# Patient Record
Sex: Male | Born: 1995 | Race: White | Hispanic: No | Marital: Single | Smoking: Current every day smoker
Health system: Southern US, Community
[De-identification: ages and names within clinical notes are randomized; demographics above are authoritative.]

## PROBLEM LIST (undated history)

## (undated) DIAGNOSIS — R011 Cardiac murmur, unspecified: Secondary | ICD-10-CM

---
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
## 2015-02-07 ENCOUNTER — Emergency Department (HOSPITAL_COMMUNITY)
Admission: EM | Admit: 2015-02-07 | Discharge: 2015-02-07 | Disposition: A | Discharge status: Home / Self Care | Payer: 59 | Attending: Emergency Medicine | Admitting: Emergency Medicine

## 2015-02-07 ENCOUNTER — Emergency Department (HOSPITAL_COMMUNITY): Payer: 59

## 2015-02-07 ENCOUNTER — Encounter (HOSPITAL_COMMUNITY): Payer: Self-pay | Admitting: Emergency Medicine

## 2015-02-07 DIAGNOSIS — R011 Cardiac murmur, unspecified: Secondary | ICD-10-CM | POA: Diagnosis not present

## 2015-02-07 DIAGNOSIS — R1013 Epigastric pain: Secondary | ICD-10-CM | POA: Diagnosis not present

## 2015-02-07 DIAGNOSIS — X30XXXA Exposure to excessive natural heat, initial encounter: Secondary | ICD-10-CM | POA: Insufficient documentation

## 2015-02-07 DIAGNOSIS — Y990 Civilian activity done for income or pay: Secondary | ICD-10-CM | POA: Insufficient documentation

## 2015-02-07 DIAGNOSIS — R109 Unspecified abdominal pain: Secondary | ICD-10-CM

## 2015-02-07 DIAGNOSIS — Y9389 Activity, other specified: Secondary | ICD-10-CM | POA: Insufficient documentation

## 2015-02-07 DIAGNOSIS — E860 Dehydration: Secondary | ICD-10-CM | POA: Insufficient documentation

## 2015-02-07 DIAGNOSIS — Z720 Tobacco use: Secondary | ICD-10-CM | POA: Insufficient documentation

## 2015-02-07 DIAGNOSIS — Y9289 Other specified places as the place of occurrence of the external cause: Secondary | ICD-10-CM | POA: Diagnosis not present

## 2015-02-07 DIAGNOSIS — T675XXA Heat exhaustion, unspecified, initial encounter: Secondary | ICD-10-CM | POA: Insufficient documentation

## 2015-02-07 DIAGNOSIS — R1110 Vomiting, unspecified: Secondary | ICD-10-CM | POA: Diagnosis not present

## 2015-02-07 HISTORY — DX: Cardiac murmur, unspecified: R01.1

## 2015-02-07 LAB — URINALYSIS, ROUTINE W REFLEX MICROSCOPIC
Glucose, UA: NEGATIVE mg/dL
HGB URINE DIPSTICK: NEGATIVE
Ketones, ur: >80 mg/dL — AB
Leukocytes, UA: NEGATIVE
Nitrite: NEGATIVE
Protein, ur: NEGATIVE mg/dL
SPECIFIC GRAVITY, URINE: 1.03 (ref 1.005–1.030)
Urobilinogen, UA: 0.2 mg/dL (ref 0.0–1.0)
pH: 5.5 (ref 5.0–8.0)

## 2015-02-07 LAB — CBC WITH DIFFERENTIAL/PLATELET
Basophils Absolute: 0 10*3/uL (ref 0.0–0.1)
Basophils Relative: 0 % (ref 0–1)
EOS PCT: 0 % (ref 0–5)
Eosinophils Absolute: 0 10*3/uL (ref 0.0–0.7)
HCT: 39.2 % (ref 39.0–52.0)
Hemoglobin: 14.2 g/dL (ref 13.0–17.0)
LYMPHS ABS: 2.4 10*3/uL (ref 0.7–4.0)
LYMPHS PCT: 10 % — AB (ref 12–46)
MCH: 29.6 pg (ref 26.0–34.0)
MCHC: 36.2 g/dL — AB (ref 30.0–36.0)
MCV: 81.8 fL (ref 78.0–100.0)
MONOS PCT: 9 % (ref 3–12)
Monocytes Absolute: 2.2 10*3/uL — ABNORMAL HIGH (ref 0.1–1.0)
Neutro Abs: 19.3 10*3/uL — ABNORMAL HIGH (ref 1.7–7.7)
Neutrophils Relative %: 81 % — ABNORMAL HIGH (ref 43–77)
PLATELETS: 307 10*3/uL (ref 150–400)
RBC: 4.79 MIL/uL (ref 4.22–5.81)
RDW: 12 % (ref 11.5–15.5)
WBC: 23.9 10*3/uL — ABNORMAL HIGH (ref 4.0–10.5)

## 2015-02-07 LAB — COMPREHENSIVE METABOLIC PANEL
ALT: 17 U/L (ref 17–63)
AST: 26 U/L (ref 15–41)
Albumin: 5.5 g/dL — ABNORMAL HIGH (ref 3.5–5.0)
Alkaline Phosphatase: 83 U/L (ref 38–126)
Anion gap: 18 — ABNORMAL HIGH (ref 5–15)
BUN: 16 mg/dL (ref 6–20)
CHLORIDE: 101 mmol/L (ref 101–111)
CO2: 19 mmol/L — AB (ref 22–32)
Calcium: 9.2 mg/dL (ref 8.9–10.3)
Creatinine, Ser: 0.99 mg/dL (ref 0.61–1.24)
Glucose, Bld: 95 mg/dL (ref 65–99)
Potassium: 3.6 mmol/L (ref 3.5–5.1)
Sodium: 138 mmol/L (ref 135–145)
Total Bilirubin: 2.5 mg/dL — ABNORMAL HIGH (ref 0.3–1.2)
Total Protein: 8 g/dL (ref 6.5–8.1)

## 2015-02-07 LAB — LIPASE, BLOOD: Lipase: 11 U/L — ABNORMAL LOW (ref 22–51)

## 2015-02-07 LAB — I-STAT TROPONIN, ED: TROPONIN I, POC: 0.02 ng/mL (ref 0.00–0.08)

## 2015-02-07 MED ORDER — SODIUM CHLORIDE 0.9 % IV BOLUS (SEPSIS)
1000.0000 mL | Freq: Once | INTRAVENOUS | Status: AC
  Administered 2015-02-07: 1000 mL via INTRAVENOUS

## 2015-02-07 MED ORDER — METOCLOPRAMIDE HCL 10 MG PO TABS: 10.0000 mg | ORAL_TABLET | Freq: Four times a day (QID) | ORAL | Status: DC | PRN

## 2015-02-07 MED ORDER — ONDANSETRON HCL 4 MG/2ML IJ SOLN
4.0000 mg | Freq: Once | INTRAMUSCULAR | Status: AC
  Administered 2015-02-07: 4 mg via INTRAVENOUS
  Filled 2015-02-07: qty 2

## 2015-02-07 NOTE — ED Provider Notes (Signed)
CSN: [id number]     Arrival date & time 02/07/15  1804 History   First MD Initiated Contact with Patient 02/07/15 1826     Chief Complaint  Patient presents with  . Heat Exposure  . Emesis     (Consider location/radiation/quality/duration/timing/severity/associated sxs/prior Treatment) The history is provided by the patient.  Hector Sutton is a 19 y.o. male who presented with possible heat exhaustion, nausea vomiting. Patient works at [employer] and has been in the truck unloading boxes all day. States that the temperature in the truck has come up to about 100F. he has not been drinking much water and only ate a biscuit for breakfast and skipped lunch. He got home and feels nauseated and has some abdominal pain. He vomited several times as well. Has some chest pain after he vomits.   Past Medical History  Diagnosis Date  . Heart murmur    History reviewed. No pertinent past surgical history. No family history on file. History  Substance Use Topics  . Smoking status: Current Every Day Smoker  . Smokeless tobacco: Not on file  . Alcohol Use: Yes    Review of Systems  Gastrointestinal: Positive for vomiting.  All other systems reviewed and are negative.     Allergies  Review of patient's allergies indicates no known allergies.  Home Medications   Prior to Admission medications   Not on File   BP 125/63 mmHg  Pulse 85  Temp(Src) 97.7 F (36.5 C) (Oral)  Resp 16  SpO2 100% Physical Exam  Constitutional: He is oriented to person, place, and time.  Dehydrated   HENT:  Head: Normocephalic.  MM slightly dry   Eyes: Conjunctivae are normal. Pupils are equal, round, and reactive to light.  Neck: Normal range of motion. Neck supple.  Cardiovascular: Normal rate, regular rhythm and normal heart sounds.   Pulmonary/Chest: Effort normal and breath sounds normal. No respiratory distress. He has no wheezes. He has no rales.  Abdominal: Soft. Bowel sounds are normal.  Mild  epigastric tenderness, no RUQ tenderness   Musculoskeletal: Normal range of motion. He exhibits no edema or tenderness.  Neurological: He is alert and oriented to person, place, and time. No cranial nerve deficit. Coordination normal.  Skin: Skin is warm and dry.  Psychiatric: He has a normal mood and affect. His behavior is normal. Judgment and thought content normal.  Nursing note and vitals reviewed.   ED Course  Procedures (including critical care time) Labs Review Labs Reviewed  CBC WITH DIFFERENTIAL/PLATELET - Abnormal; Notable for the following:    WBC 23.9 (*)    MCHC 36.2 (*)    Neutrophils Relative % 81 (*)    Lymphocytes Relative 10 (*)    Neutro Abs 19.3 (*)    Monocytes Absolute 2.2 (*)    All other components within normal limits  COMPREHENSIVE METABOLIC PANEL - Abnormal; Notable for the following:    CO2 19 (*)    Albumin 5.5 (*)    Total Bilirubin 2.5 (*)    Anion gap 18 (*)    All other components within normal limits  LIPASE, BLOOD - Abnormal; Notable for the following:    Lipase 11 (*)    All other components within normal limits  URINALYSIS, ROUTINE W REFLEX MICROSCOPIC (NOT AT [facility]) - Abnormal; Notable for the following:    Bilirubin Urine SMALL (*)    Ketones, ur >80 (*)    All other components within normal limits  Rosezena Sensor, ED  Imaging Review Dg Abd Acute W/chest  02/07/2015   CLINICAL DATA:  Vomiting.  EXAM: DG ABDOMEN ACUTE W/ 1V CHEST  COMPARISON:  06/18/2013 abdominal CT  FINDINGS: There is no evidence of dilated bowel loops or free intraperitoneal air. No radiopaque calculi or other significant radiographic abnormality is seen. Heart size and mediastinal contours are within normal limits. Both lungs are clear.  IMPRESSION: Negative abdominal radiographs.  No acute cardiopulmonary disease.   Electronically Signed   By: Marnee Spring M.D.   On: 02/07/2015 19:15     EKG Interpretation   Date/Time:  Tuesday February 07 2015 18:35:19  EDT Ventricular Rate:  89 PR Interval:  139 QRS Duration: 110 QT Interval:  397 QTC Calculation: 483 R Axis:   100 Text Interpretation:  Sinus arrhythmia Borderline right axis deviation  Borderline Q waves in lateral leads Borderline prolonged QT interval No  significant change since last tracing Confirmed by Miyani Cronic  MD, Aikam Vinje ([id number])  on 02/07/2015 6:55:07 PM      MDM   Final diagnoses:  Vomiting  Abdominal pain   Hector Sutton is a 19 y.o. male here with ab pain, possible heat exhaustion. Temp nl. Appears slightly dehydrated. Will check labs, UA, hydrate.   8pm WBC 24. Bicarb 19. Ordered CT ab/pel initially but patient refused. Repeat ab exam showed nontender abdomen. Bili 2.5, improved from previous. States that he has gingivitis. On exam, has no periapical abscess or facial swelling. Had WBC 23 2 years ago and CT at that time showed gastro. Since he has no other signs of sepsis, likely stress from dehydration and heat exhaustion.   8:48 PM UA > 80 ketones. Given 2 L NS. Will dc home.    Richardean Canal, MD 02/07/15 2049

## 2015-02-07 NOTE — Discharge Instructions (Signed)
Stay hydrated.   Take reglan for nausea.   Your white blood cell count is elevated, repeat in a week.   Follow up with your doctor.   Return to ER if you have worse vomiting, dehydration, fever.

## 2015-02-07 NOTE — ED Notes (Signed)
Patient is unable to urinate at this time 

## 2015-02-07 NOTE — ED Notes (Signed)
Pt states he started working today at [employer] and feels like he may have heat exhaustion. N/V. Alert and oriented.

## 2015-03-03 ENCOUNTER — Emergency Department (HOSPITAL_COMMUNITY)
Admission: EM | Admit: 2015-03-03 | Discharge: 2015-03-03 | Disposition: A | Discharge status: Home / Self Care | Payer: 59 | Attending: Emergency Medicine | Admitting: Emergency Medicine

## 2015-03-03 ENCOUNTER — Encounter (HOSPITAL_COMMUNITY): Payer: Self-pay | Admitting: Emergency Medicine

## 2015-03-03 DIAGNOSIS — R531 Weakness: Secondary | ICD-10-CM | POA: Diagnosis not present

## 2015-03-03 DIAGNOSIS — R011 Cardiac murmur, unspecified: Secondary | ICD-10-CM | POA: Insufficient documentation

## 2015-03-03 DIAGNOSIS — Z720 Tobacco use: Secondary | ICD-10-CM | POA: Diagnosis not present

## 2015-03-03 LAB — CBC WITH DIFFERENTIAL/PLATELET
BASOS ABS: 0 10*3/uL (ref 0.0–0.1)
Basophils Relative: 1 % (ref 0–1)
EOS PCT: 2 % (ref 0–5)
Eosinophils Absolute: 0.1 10*3/uL (ref 0.0–0.7)
HCT: 42.8 % (ref 39.0–52.0)
Hemoglobin: 14.9 g/dL (ref 13.0–17.0)
LYMPHS PCT: 25 % (ref 12–46)
Lymphs Abs: 1.4 10*3/uL (ref 0.7–4.0)
MCH: 30.3 pg (ref 26.0–34.0)
MCHC: 34.8 g/dL (ref 30.0–36.0)
MCV: 87.2 fL (ref 78.0–100.0)
MONO ABS: 0.5 10*3/uL (ref 0.1–1.0)
MONOS PCT: 9 % (ref 3–12)
NEUTROS ABS: 3.5 10*3/uL (ref 1.7–7.7)
Neutrophils Relative %: 63 % (ref 43–77)
Platelets: 267 10*3/uL (ref 150–400)
RBC: 4.91 MIL/uL (ref 4.22–5.81)
RDW: 13 % (ref 11.5–15.5)
WBC: 5.5 10*3/uL (ref 4.0–10.5)

## 2015-03-03 LAB — COMPREHENSIVE METABOLIC PANEL
ALT: 20 U/L (ref 17–63)
AST: 19 U/L (ref 15–41)
Albumin: 4.8 g/dL (ref 3.5–5.0)
Alkaline Phosphatase: 84 U/L (ref 38–126)
Anion gap: 5 (ref 5–15)
BUN: 15 mg/dL (ref 6–20)
CALCIUM: 8.8 mg/dL — AB (ref 8.9–10.3)
CHLORIDE: 106 mmol/L (ref 101–111)
CO2: 27 mmol/L (ref 22–32)
Creatinine, Ser: 0.71 mg/dL (ref 0.61–1.24)
GFR calc Af Amer: >60 mL/min (ref >60)
GLUCOSE: 95 mg/dL (ref 65–99)
Potassium: 4.4 mmol/L (ref 3.5–5.1)
SODIUM: 138 mmol/L (ref 135–145)
Total Bilirubin: 1.2 mg/dL (ref 0.3–1.2)
Total Protein: 7.7 g/dL (ref 6.5–8.1)

## 2015-03-03 LAB — CK: Total CK: 120 U/L (ref 49–397)

## 2015-03-03 LAB — TSH: TSH: 0.953 u[IU]/mL (ref 0.350–4.500)

## 2015-03-03 MED ORDER — SODIUM CHLORIDE 0.9 % IV BOLUS (SEPSIS)
1000.0000 mL | Freq: Once | INTRAVENOUS | Status: AC
  Administered 2015-03-03: 1000 mL via INTRAVENOUS

## 2015-03-03 NOTE — Discharge Instructions (Signed)
If you were given medicines take as directed.  If you are on coumadin or contraceptives realize their levels and effectiveness is altered by many different medicines.  If you have any reaction (rash, tongues swelling, other) to the medicines stop taking and see a physician.   Follow up with primary doctor next week for Thyroid test result that was sent in the ER.   If your blood pressure was elevated in the ER make sure you follow up for management with a primary doctor or return for chest pain, shortness of breath or stroke symptoms.  Please follow up as directed and return to the ER or see a physician for new or worsening symptoms.  Thank you. Filed Vitals:   03/03/15 0950  BP: 114/54  Pulse: 67  Temp: 98.2 F (36.8 C)  TempSrc: Oral  Resp: 16  SpO2: 100%

## 2015-03-03 NOTE — ED Notes (Signed)
MD at bedside. 

## 2015-03-03 NOTE — ED Notes (Signed)
Questions r/t dc were denied. Pt ambulatory and a&ox4 

## 2015-03-03 NOTE — ED Notes (Signed)
Per pt, [place]lab work showed he had infection-still having symptoms and eants to be checked out

## 2015-03-03 NOTE — ED Provider Notes (Signed)
CSN: [id number]     Arrival date & time 03/03/15  7829 History   First MD Initiated Contact with Patient 03/03/15 [phone]     Chief Complaint  Patient presents with  . Weakness     (Consider location/radiation/quality/duration/timing/severity/associated sxs/prior Treatment) HPI Comments: 19 year old male with no significant medical history, cigarettes smoker presents with general fatigue and general weakness for the past few weeks. Patient was seen a few weeks back and had general blood work showing significant leukocytosis, patient was dehydrated and heat exhaustion, treated with fluids and improved. Patient reports generally worsening weakness, works in the heat however has been tolerating oral fluids. No vomiting or headaches. No weight loss however no weight gain despite eating regular meals. No lymphadenopathy appreciated. No thyroid history known. Nothing improves his general fatigue.  Patient is a 19 y.o. male presenting with weakness. The history is provided by the patient.  Weakness Pertinent negatives include no chest pain, no abdominal pain, no headaches and no shortness of breath.    Past Medical History  Diagnosis Date  . Heart murmur    History reviewed. No pertinent past surgical history. No family history on file. History  Substance Use Topics  . Smoking status: Current Every Day Smoker  . Smokeless tobacco: Not on file  . Alcohol Use: Yes    Review of Systems  Constitutional: Positive for fatigue. Negative for fever and chills.  HENT: Negative for congestion.   Eyes: Negative for visual disturbance.  Respiratory: Negative for shortness of breath.   Cardiovascular: Negative for chest pain.  Gastrointestinal: Negative for vomiting and abdominal pain.  Genitourinary: Negative for dysuria and flank pain.  Musculoskeletal: Negative for back pain, neck pain and neck stiffness.  Skin: Negative for rash.  Neurological: Positive for weakness. Negative for light-headedness  and headaches.      Allergies  Review of patient's allergies indicates no known allergies.  Home Medications   Prior to Admission medications   Medication Sig Start Date End Date Taking? Authorizing Provider  metoCLOPramide (REGLAN) 10 MG tablet Take 1 tablet (10 mg total) by mouth every 6 (six) hours as needed for nausea. Patient not taking: Reported on 03/03/2015 02/07/15   Richardean Canal, MD   BP 114/54 mmHg  Pulse 67  Temp(Src) 98.2 F (36.8 C) (Oral)  Resp 16  SpO2 100% Physical Exam  Constitutional: He is oriented to person, place, and time. He appears well-developed and well-nourished.  HENT:  Head: Normocephalic and atraumatic.  No exudate erythema or swelling posterior pharynx, neck supple no significant lymphadenopathy, mild dry mucous membranes.  Eyes: Conjunctivae are normal. Right eye exhibits no discharge. Left eye exhibits no discharge.  Neck: Normal range of motion. Neck supple. No tracheal deviation present.  Cardiovascular: Normal rate and regular rhythm.   No murmur heard. Pulmonary/Chest: Effort normal and breath sounds normal.  Abdominal: Soft. He exhibits no distension. There is no tenderness. There is no guarding.  Musculoskeletal: He exhibits no edema.  Neurological: He is alert and oriented to person, place, and time.  Skin: Skin is warm. No rash noted.  No significant lymphadenopathy cervical, supraclavicular, femoral  Psychiatric: He has a normal mood and affect.  Nursing note and vitals reviewed.   ED Course  Procedures (including critical care time) Labs Review Labs Reviewed  COMPREHENSIVE METABOLIC PANEL - Abnormal; Notable for the following:    Calcium 8.8 (*)    All other components within normal limits  CBC WITH DIFFERENTIAL/PLATELET  CK  TSH  Imaging Review No results found.   EKG Interpretation None      MDM   Final diagnoses:  General weakness   Patient presents with general fatigue/weakness, no source of infection on  exam, well-appearing. Reviewed last blood work showing significant leukocytosis however patient is dehydrated and nauseated at the time. Plan for repeat blood work, IV fluids. Discussed importance of outpatient follow-up. Screening blood work unremarkable, thyroid test pending. Patient stable to follow-up outpatient for thyroid test result. Results and differential diagnosis were discussed with the patient/parent/guardian. Xrays were independently reviewed by myself.  Close follow up outpatient was discussed, comfortable with the plan.   Medications  sodium chloride 0.9 % bolus 1,000 mL (1,000 mLs Intravenous New Bag/Given 03/03/15 1023)    Filed Vitals:   03/03/15 0950  BP: 114/54  Pulse: 67  Temp: 98.2 F (36.8 C)  TempSrc: Oral  Resp: 16  SpO2: 100%    Final diagnoses:  General weakness       Blane OharaJoshua Ulric Salzman, MD 03/03/15 [phone]

## 2015-04-09 ENCOUNTER — Emergency Department (HOSPITAL_COMMUNITY)
Admission: EM | Admit: 2015-04-09 | Discharge: 2015-04-09 | Disposition: A | Discharge status: Home / Self Care | Payer: Commercial Managed Care - HMO | Attending: Emergency Medicine | Admitting: Emergency Medicine

## 2015-04-09 ENCOUNTER — Encounter (HOSPITAL_COMMUNITY): Payer: Self-pay | Admitting: Emergency Medicine

## 2015-04-09 DIAGNOSIS — Y9389 Activity, other specified: Secondary | ICD-10-CM | POA: Insufficient documentation

## 2015-04-09 DIAGNOSIS — Y9289 Other specified places as the place of occurrence of the external cause: Secondary | ICD-10-CM | POA: Diagnosis not present

## 2015-04-09 DIAGNOSIS — R011 Cardiac murmur, unspecified: Secondary | ICD-10-CM | POA: Insufficient documentation

## 2015-04-09 DIAGNOSIS — W25XXXA Contact with sharp glass, initial encounter: Secondary | ICD-10-CM | POA: Diagnosis not present

## 2015-04-09 DIAGNOSIS — Y998 Other external cause status: Secondary | ICD-10-CM | POA: Insufficient documentation

## 2015-04-09 DIAGNOSIS — T07XXXA Unspecified multiple injuries, initial encounter: Secondary | ICD-10-CM

## 2015-04-09 DIAGNOSIS — S0181XA Laceration without foreign body of other part of head, initial encounter: Secondary | ICD-10-CM | POA: Diagnosis not present

## 2015-04-09 DIAGNOSIS — S41111A Laceration without foreign body of right upper arm, initial encounter: Secondary | ICD-10-CM | POA: Insufficient documentation

## 2015-04-09 DIAGNOSIS — S41011A Laceration without foreign body of right shoulder, initial encounter: Secondary | ICD-10-CM | POA: Diagnosis not present

## 2015-04-09 DIAGNOSIS — S4991XA Unspecified injury of right shoulder and upper arm, initial encounter: Secondary | ICD-10-CM | POA: Diagnosis present

## 2015-04-09 DIAGNOSIS — Z720 Tobacco use: Secondary | ICD-10-CM | POA: Diagnosis not present

## 2015-04-09 MED ORDER — HYDROCODONE-ACETAMINOPHEN 5-325 MG PO TABS
2.0000 | ORAL_TABLET | Freq: Once | ORAL | Status: AC
  Administered 2015-04-09: 2 via ORAL
  Filled 2015-04-09: qty 2

## 2015-04-09 NOTE — Discharge Instructions (Signed)
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
## 2015-05-27 ENCOUNTER — Encounter (HOSPITAL_COMMUNITY): Payer: Self-pay | Admitting: *Deleted

## 2015-05-27 ENCOUNTER — Emergency Department (HOSPITAL_COMMUNITY)
Admission: EM | Admit: 2015-05-27 | Discharge: 2015-05-27 | Disposition: A | Discharge status: Home / Self Care | Payer: Commercial Managed Care - HMO | Attending: Emergency Medicine | Admitting: Emergency Medicine

## 2015-05-27 DIAGNOSIS — R011 Cardiac murmur, unspecified: Secondary | ICD-10-CM | POA: Insufficient documentation

## 2015-05-27 DIAGNOSIS — H9201 Otalgia, right ear: Secondary | ICD-10-CM | POA: Insufficient documentation

## 2015-05-27 DIAGNOSIS — J029 Acute pharyngitis, unspecified: Secondary | ICD-10-CM | POA: Insufficient documentation

## 2015-05-27 DIAGNOSIS — Z720 Tobacco use: Secondary | ICD-10-CM | POA: Insufficient documentation

## 2015-05-27 LAB — RAPID STREP SCREEN (MED CTR MEBANE ONLY): STREPTOCOCCUS, GROUP A SCREEN (DIRECT): NEGATIVE

## 2015-05-27 MED ORDER — IBUPROFEN 600 MG PO TABS: 600.0000 mg | ORAL_TABLET | Freq: Four times a day (QID) | ORAL | Status: DC | PRN

## 2015-05-27 MED ORDER — IBUPROFEN 800 MG PO TABS
800.0000 mg | ORAL_TABLET | Freq: Once | ORAL | Status: DC
Start: 2015-05-27 — End: 2015-05-28

## 2015-05-27 NOTE — ED Notes (Signed)
Discharge instructions given, pt demonstrated teach back and verbal understanding. No concerns voiced.  

## 2015-05-27 NOTE — Discharge Instructions (Signed)
Your strep test is negative tonight, so your symptoms are most likely viral which will improve with time.  Use the ibuprofen for pain relief.     Pharyngitis Pharyngitis is redness, pain, and swelling (inflammation) of your pharynx.  CAUSES  Pharyngitis is usually caused by infection. Most of the time, these infections are from viruses (viral) and are part of a cold. However, sometimes pharyngitis is caused by bacteria (bacterial). Pharyngitis can also be caused by allergies. Viral pharyngitis may be spread from person to person by coughing, sneezing, and personal items or utensils (cups, forks, spoons, toothbrushes). Bacterial pharyngitis may be spread from person to person by more intimate contact, such as kissing.  SIGNS AND SYMPTOMS  Symptoms of pharyngitis include:   Sore throat.   Tiredness (fatigue).   Low-grade fever.   Headache.  Joint pain and muscle aches.  Skin rashes.  Swollen lymph nodes.  Plaque-like film on throat or tonsils (often seen with bacterial pharyngitis). DIAGNOSIS  Your health care provider will ask you questions about your illness and your symptoms. Your medical history, along with a physical exam, is often all that is needed to diagnose pharyngitis. Sometimes, a rapid strep test is done. Other lab tests may also be done, depending on the suspected cause.  TREATMENT  Viral pharyngitis will usually get better in 3-4 days without the use of medicine. Bacterial pharyngitis is treated with medicines that kill germs (antibiotics).  HOME CARE INSTRUCTIONS   Drink enough water and fluids to keep your urine clear or pale yellow.   Only take over-the-counter or prescription medicines as directed by your health care provider:   If you are prescribed antibiotics, make sure you finish them even if you start to feel better.   Do not take aspirin.   Get lots of rest.   Gargle with 8 oz of salt water ( tsp of salt per 1 qt of water) as often as every  1-2 hours to soothe your throat.   Throat lozenges (if you are not at risk for choking) or sprays may be used to soothe your throat. SEEK MEDICAL CARE IF:   You have large, tender lumps in your neck.  You have a rash.  You cough up green, yellow-brown, or bloody spit. SEEK IMMEDIATE MEDICAL CARE IF:   Your neck becomes stiff.  You drool or are unable to swallow liquids.  You vomit or are unable to keep medicines or liquids down.  You have severe pain that does not go away with the use of recommended medicines.  You have trouble breathing (not caused by a stuffy nose). MAKE SURE YOU:   Understand these instructions.  Will watch your condition.  Will get help right away if you are not doing well or get worse.   This information is not intended to replace advice given to you by your health care provider. Make sure you discuss any questions you have with your health care provider.   Document Released: 08/05/2005 Document Revised: 05/26/2013 Document Reviewed: 04/12/2013 Elsevier Interactive Patient Education [employer]

## 2015-05-27 NOTE — ED Provider Notes (Signed)
CSN: [id number]     Arrival date & time 05/27/15  2055 History   First MD Initiated Contact with Patient 05/27/15 2133     Chief Complaint  Patient presents with  . Otalgia     (Consider location/radiation/quality/duration/timing/severity/associated sxs/prior Treatment) The history is provided by a parent.   Hector Sutton is a 19 y.o. male presenting with right sided ear pain along with sore throat and right swollen lymph node underneath his jaw present for the past 2 days.  His pain is worsened with swallowing.  He denies fever or chills, denies nasal congestion, drainage, cough, sob, headache or dizziness.  He has taken no medicines for his pain prior to arrival.    Past Medical History  Diagnosis Date  . Heart murmur    History reviewed. No pertinent past surgical history. History reviewed. No pertinent family history. Social History  Substance Use Topics  . Smoking status: Current Every Day Smoker  . Smokeless tobacco: None  . Alcohol Use: Yes    Review of Systems  Constitutional: Negative for fever and chills.  HENT: Positive for ear pain and sore throat. Negative for congestion, ear discharge, facial swelling, rhinorrhea, sinus pressure, trouble swallowing and voice change.   Eyes: Negative for discharge.  Respiratory: Negative for cough, shortness of breath, wheezing and stridor.   Cardiovascular: Negative for chest pain.  Gastrointestinal: Negative for abdominal pain.  Genitourinary: Negative.       Allergies  Review of patient's allergies indicates no known allergies.  Home Medications   Prior to Admission medications   Medication Sig Start Date End Date Taking? Authorizing Provider  ibuprofen (ADVIL,MOTRIN) 600 MG tablet Take 1 tablet (600 mg total) by mouth every 6 (six) hours as needed. 05/27/15   Burgess Amor, PA-C  metoCLOPramide (REGLAN) 10 MG tablet Take 1 tablet (10 mg total) by mouth every 6 (six) hours as needed for nausea. Patient not taking:  Reported on 03/03/2015 02/07/15   Richardean Canal, MD   BP 127/45 mmHg  Pulse 85  Temp(Src) 98.1 F (36.7 C) (Oral)  Resp 20  Ht  (1.778 m)  Wt 130 lb (58.968 kg)  BMI 18.65 kg/m2  SpO2 99% Physical Exam  Constitutional: He is oriented to person, place, and time. He appears well-developed and well-nourished.  HENT:  Head: Normocephalic and atraumatic.  Right Ear: Tympanic membrane and ear canal normal.  Left Ear: Tympanic membrane and ear canal normal.  Nose: No mucosal edema or rhinorrhea.  Mouth/Throat: Uvula is midline and mucous membranes are normal. No trismus in the jaw. Posterior oropharyngeal erythema present. No oropharyngeal exudate, posterior oropharyngeal edema or tonsillar abscesses.  Tonsils symmetric, mild edema 1+.    Eyes: Conjunctivae are normal.  Cardiovascular: Normal rate and normal heart sounds.   Pulmonary/Chest: Effort normal. No respiratory distress. He has no wheezes. He has no rales.  Musculoskeletal: Normal range of motion.  Lymphadenopathy:       Head (right side): Submandibular adenopathy present.       Head (left side): Submandibular adenopathy present.  Neurological: He is alert and oriented to person, place, and time.  Skin: Skin is warm and dry. No rash noted.  Psychiatric: He has a normal mood and affect.    ED Course  Procedures (including critical care time) Labs Review Labs Reviewed  RAPID STREP SCREEN (NOT AT [facility])  CULTURE, GROUP A STREP    Imaging Review No results found. I have personally reviewed and evaluated these images and lab  results as part of my medical decision-making.   EKG Interpretation None      MDM   Final diagnoses:  Viral pharyngitis    Pt advised rest, increased fluid intake, ibuprofen which was prescribed.  Strep test negative, culture pending.      Burgess Amor, PA-C 05/27/15 2357  Vanetta Mulders, MD 05/28/15 [phone]

## 2015-05-27 NOTE — ED Notes (Addendum)
Triage on wrong pt

## 2015-05-27 NOTE — ED Notes (Signed)
Pt c/o right sided ear pain x 2 days.

## 2015-05-31 LAB — CULTURE, GROUP A STREP: Strep A Culture: NEGATIVE

## 2016-06-18 ENCOUNTER — Encounter (HOSPITAL_COMMUNITY): Payer: Self-pay | Admitting: Emergency Medicine

## 2016-06-18 ENCOUNTER — Emergency Department (HOSPITAL_COMMUNITY): Payer: Self-pay

## 2016-06-18 ENCOUNTER — Emergency Department (HOSPITAL_COMMUNITY)
Admission: EM | Admit: 2016-06-18 | Discharge: 2016-06-18 | Disposition: A | Discharge status: Home / Self Care | Payer: Self-pay | Attending: Emergency Medicine | Admitting: Emergency Medicine

## 2016-06-18 DIAGNOSIS — Y929 Unspecified place or not applicable: Secondary | ICD-10-CM | POA: Insufficient documentation

## 2016-06-18 DIAGNOSIS — S0990XA Unspecified injury of head, initial encounter: Secondary | ICD-10-CM | POA: Insufficient documentation

## 2016-06-18 DIAGNOSIS — Y999 Unspecified external cause status: Secondary | ICD-10-CM | POA: Insufficient documentation

## 2016-06-18 DIAGNOSIS — Y939 Activity, unspecified: Secondary | ICD-10-CM | POA: Insufficient documentation

## 2016-06-18 DIAGNOSIS — R6884 Jaw pain: Secondary | ICD-10-CM | POA: Insufficient documentation

## 2016-06-18 DIAGNOSIS — F17200 Nicotine dependence, unspecified, uncomplicated: Secondary | ICD-10-CM | POA: Insufficient documentation

## 2016-06-18 DIAGNOSIS — F8081 Childhood onset fluency disorder: Secondary | ICD-10-CM | POA: Insufficient documentation

## 2016-06-18 MED ORDER — IBUPROFEN 400 MG PO TABS
600.0000 mg | ORAL_TABLET | Freq: Once | ORAL | Status: AC
  Administered 2016-06-18: 600 mg via ORAL
  Filled 2016-06-18: qty 1

## 2016-06-18 MED ORDER — NAPROXEN 500 MG PO TABS: 500.0000 mg | ORAL_TABLET | Freq: Two times a day (BID) | ORAL | 0 refills | Status: DC
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
## 2016-06-18 NOTE — Discharge Instructions (Signed)
Please follow up with neurology if continue to have stutter and memory issues. Ice pack to the jaw. Take tylenol or motrin. Return if any issues

## 2016-09-08 ENCOUNTER — Emergency Department (HOSPITAL_COMMUNITY): Payer: 59

## 2016-09-08 ENCOUNTER — Encounter (HOSPITAL_COMMUNITY): Payer: Self-pay | Admitting: Emergency Medicine

## 2016-09-08 ENCOUNTER — Emergency Department (HOSPITAL_COMMUNITY)
Admission: EM | Admit: 2016-09-08 | Discharge: 2016-09-08 | Disposition: A | Discharge status: Home / Self Care | Payer: 59 | Attending: Emergency Medicine | Admitting: Emergency Medicine

## 2016-09-08 DIAGNOSIS — F17200 Nicotine dependence, unspecified, uncomplicated: Secondary | ICD-10-CM | POA: Insufficient documentation

## 2016-09-08 DIAGNOSIS — J111 Influenza due to unidentified influenza virus with other respiratory manifestations: Secondary | ICD-10-CM | POA: Insufficient documentation

## 2016-09-08 DIAGNOSIS — R69 Illness, unspecified: Secondary | ICD-10-CM

## 2016-09-08 LAB — CBC WITH DIFFERENTIAL/PLATELET
Basophils Absolute: 0 10*3/uL (ref 0.0–0.1)
Basophils Relative: 0 %
Eosinophils Absolute: 0.2 10*3/uL (ref 0.0–0.7)
Eosinophils Relative: 3 %
HEMATOCRIT: 44 % (ref 39.0–52.0)
HEMOGLOBIN: 15.7 g/dL (ref 13.0–17.0)
LYMPHS PCT: 18 %
Lymphs Abs: 1.8 10*3/uL (ref 0.7–4.0)
MCH: 30.5 pg (ref 26.0–34.0)
MCHC: 35.7 g/dL (ref 30.0–36.0)
MCV: 85.4 fL (ref 78.0–100.0)
MONO ABS: 1.1 10*3/uL — AB (ref 0.1–1.0)
MONOS PCT: 11 %
NEUTROS ABS: 6.6 10*3/uL (ref 1.7–7.7)
NEUTROS PCT: 68 %
Platelets: 267 10*3/uL (ref 150–400)
RBC: 5.15 MIL/uL (ref 4.22–5.81)
RDW: 12.2 % (ref 11.5–15.5)
WBC: 9.7 10*3/uL (ref 4.0–10.5)

## 2016-09-08 LAB — BASIC METABOLIC PANEL
ANION GAP: 9 (ref 5–15)
BUN: 12 mg/dL (ref 6–20)
CALCIUM: 9.3 mg/dL (ref 8.9–10.3)
CHLORIDE: 104 mmol/L (ref 101–111)
CO2: 25 mmol/L (ref 22–32)
CREATININE: 0.74 mg/dL (ref 0.61–1.24)
GFR calc non Af Amer: >60 mL/min (ref >60)
GLUCOSE: 102 mg/dL — AB (ref 65–99)
Potassium: 3.7 mmol/L (ref 3.5–5.1)
Sodium: 138 mmol/L (ref 135–145)

## 2016-09-08 MED ORDER — BENZONATATE 100 MG PO CAPS: 100.0000 mg | ORAL_CAPSULE | Freq: Three times a day (TID) | ORAL | 0 refills | Status: DC | PRN

## 2016-09-08 MED ORDER — FLUTICASONE PROPIONATE 50 MCG/ACT NA SUSP: 2.0000 | Freq: Every day | NASAL | 0 refills | Status: DC

## 2016-09-08 MED ORDER — ACETAMINOPHEN 325 MG PO TABS
650.0000 mg | ORAL_TABLET | Freq: Once | ORAL | Status: AC
  Administered 2016-09-08: 650 mg via ORAL
  Filled 2016-09-08: qty 2

## 2016-09-08 MED ORDER — NAPROXEN 500 MG PO TABS: 500.0000 mg | ORAL_TABLET | Freq: Two times a day (BID) | ORAL | 0 refills | Status: DC

## 2016-09-08 MED ORDER — CETIRIZINE HCL 10 MG PO TABS: 10.0000 mg | ORAL_TABLET | Freq: Every day | ORAL | 1 refills | Status: DC

## 2016-09-08 NOTE — ED Provider Notes (Signed)
[facility] Provider Note   CSN: [id number] Arrival date & time: 09/08/16  0865     History   Chief Complaint Chief Complaint  Patient presents with  . Cough    Nasal Congestion   . Fever  . Generalized Body Aches    HPI Hector Sutton is a 21 y.o. male.  Hector Sutton is a 21 y.o. Male who is otherwise healthy who presents to the ED complaining of flu like symptoms for the past three days. The patient reports subjective fever, cough, body aches, chills, nasal congestion, and post nasal drip for the past three days. No treatments prior to arrival. Patient did not receive his flu shot this year. Patient denies hemoptysis, neck pain, trouble swallowing, ear discharge, chest pain, wheezing, shortness of breath, abdominal pain, nausea, vomiting or rashes.   The history is provided by the patient. No language interpreter was used.  Cough  Associated symptoms include rhinorrhea. Pertinent negatives include no chest pain, no chills, no headaches, no sore throat, no shortness of breath and no wheezing.  Fever   Associated symptoms include congestion and cough. Pertinent negatives include no chest pain, no diarrhea, no vomiting, no headaches and no sore throat.    Past Medical History:  Diagnosis Date  . Heart murmur     There are no active problems to display for this patient.   History reviewed. No pertinent surgical history.     Home Medications    Prior to Admission medications   Medication Sig Start Date End Date Taking? Authorizing Provider  benzonatate (TESSALON) 100 MG capsule Take 1 capsule (100 mg total) by mouth 3 (three) times daily as needed for cough. 09/08/16   Everlene Farrier, PA-C  cetirizine (ZYRTEC ALLERGY) 10 MG tablet Take 1 tablet (10 mg total) by mouth daily. 09/08/16   Everlene Farrier, PA-C  fluticasone (FLONASE) 50 MCG/ACT nasal spray Place 2 sprays into both nostrils daily. 09/08/16   Everlene Farrier, PA-C  metoCLOPramide (REGLAN) 10 MG  tablet Take 1 tablet (10 mg total) by mouth every 6 (six) hours as needed for nausea. Patient not taking: Reported on 06/18/2016 02/07/15   Charlynne Pander, MD  naproxen (NAPROSYN) 500 MG tablet Take 1 tablet (500 mg total) by mouth 2 (two) times daily with a meal. 09/08/16   Everlene Farrier, PA-C    Family History No family history on file.  Social History Social History  Substance Use Topics  . Smoking status: Current Every Day Smoker  . Smokeless tobacco: Never Used  . Alcohol use Yes     Allergies   Patient has no known allergies.   Review of Systems Review of Systems  Constitutional: Positive for fever. Negative for chills.  HENT: Positive for congestion, postnasal drip, rhinorrhea, sinus pain and sneezing. Negative for sore throat and trouble swallowing.   Eyes: Negative for visual disturbance.  Respiratory: Positive for cough. Negative for shortness of breath and wheezing.   Cardiovascular: Negative for chest pain and palpitations.  Gastrointestinal: Negative for abdominal pain, diarrhea, nausea and vomiting.  Genitourinary: Negative for dysuria.  Musculoskeletal: Negative for back pain and neck pain.  Skin: Negative for rash.  Neurological: Negative for headaches.     Physical Exam Updated Vital Signs BP 153/85 (BP Location: Left Arm)   Pulse 84   Temp 98.4 F (36.9 C) (Oral)   Resp 16   Ht 5\' 10"  (1.778 m)   Wt 58.5 kg   SpO2 99%   BMI 18.51 kg/m  Physical Exam  Constitutional: He appears well-developed and well-nourished. No distress.  Nontoxic appearing.  HENT:  Head: Normocephalic and atraumatic.  Right Ear: External ear normal.  Left Ear: External ear normal.  Mouth/Throat: Oropharynx is clear and moist. No oropharyngeal exudate.  Mild middle ear effusion noted bilaterally. No TM erythema or loss of landmarks. Boggy nasal turbinates bilaterally. Rhinorrhea present. No tonsillar hypertrophy or exudates.  Eyes: Conjunctivae are normal. Pupils are  equal, round, and reactive to light. Right eye exhibits no discharge. Left eye exhibits no discharge.  Neck: Normal range of motion. Neck supple. No JVD present. No tracheal deviation present.  Cardiovascular: Normal rate, regular rhythm, normal heart sounds and intact distal pulses.   Pulmonary/Chest: Effort normal and breath sounds normal. No stridor. No respiratory distress. He has no wheezes. He has no rales.  Lungs are clear to auscultation bilaterally. No increased work of breathing. No rales or rhonchi.  Abdominal: Soft. There is no tenderness.  Musculoskeletal: He exhibits no edema or tenderness.  Lymphadenopathy:    He has no cervical adenopathy.  Neurological: He is alert. Coordination normal.  Skin: Skin is warm and dry. Capillary refill takes less than 2 seconds. No rash noted. He is not diaphoretic. No erythema. No pallor.  Psychiatric: He has a normal mood and affect. His behavior is normal.  Nursing note and vitals reviewed.    ED Treatments / Results  Labs (all labs ordered are listed, but only abnormal results are displayed) Labs Reviewed  CBC WITH DIFFERENTIAL/PLATELET - Abnormal; Notable for the following:       Result Value   Monocytes Absolute 1.1 (*)    All other components within normal limits  BASIC METABOLIC PANEL - Abnormal; Notable for the following:    Glucose, Bld 102 (*)    All other components within normal limits    EKG  EKG Interpretation None       Radiology Dg Chest 2 View  Result Date: 09/08/2016 CLINICAL DATA:  Cough, fever, and chest congestion. Shortness of breath. EXAM: CHEST  2 VIEW COMPARISON:  02/07/2015 FINDINGS: The cardiomediastinal contours are normal. The lungs are clear. Pulmonary vasculature is normal. No consolidation, pleural effusion, or pneumothorax. No acute osseous abnormalities are seen. IMPRESSION: No active cardiopulmonary disease. Electronically Signed   By: Rubye Oaks M.D.   On: 09/08/2016 06:44     Procedures Procedures (including critical care time)  Medications Ordered in ED Medications  acetaminophen (TYLENOL) tablet 650 mg (650 mg Oral Given 09/08/16 1610)     Initial Impression / Assessment and Plan / ED Course  I have reviewed the triage vital signs and the nursing notes.  Pertinent labs & imaging results that were available during my care of the patient were reviewed by me and considered in my medical decision making (see chart for details).   This is a 21 y.o. Male who is otherwise healthy who presents to the ED complaining of flu like symptoms for the past three days. The patient reports subjective fever, cough, body aches, chills, nasal congestion, and post nasal drip for the past three days. No treatments prior to arrival. Patient did not receive his flu shot this year.  Patient with symptoms consistent with influenza.  Vitals are stable.  No signs of dehydration, tolerating PO's.  Lungs are clear. Chest x-ray ordered in triage is unremarkable. Discussed the cost versus benefit of Tamiflu treatment with the patient.  The patient understands that symptoms are greater than the recommended 24-48 hour window  of treatment.  Patient will be discharged with instructions to orally hydrate, rest, and use Flonase, Zyrtec and naproxen. I advised the patient to follow-up with their primary care provider this week. I advised the patient to return to the emergency department with new or worsening symptoms or new concerns. The patient verbalized understanding and agreement with plan.      Final Clinical Impressions(s) / ED Diagnoses   Final diagnoses:  Influenza-like illness    New Prescriptions Discharge Medication List as of 09/08/2016  8:50 AM    START taking these medications   Details  benzonatate (TESSALON) 100 MG capsule Take 1 capsule (100 mg total) by mouth 3 (three) times daily as needed for cough., Starting Sun 09/08/2016, Print    cetirizine (ZYRTEC ALLERGY) 10 MG  tablet Take 1 tablet (10 mg total) by mouth daily., Starting Sun 09/08/2016, Print    fluticasone (FLONASE) 50 MCG/ACT nasal spray Place 2 sprays into both nostrils daily., Starting Sun 09/08/2016, Print         Everlene FarrierWilliam Otniel Hoe, PA-C 09/08/16 [id number]    Geoffery Lyonsouglas Delo, MD 09/08/16 [phone]

## 2016-09-08 NOTE — ED Triage Notes (Signed)
Patient reports fever , generalized body aches , productive cough , chills , fatigue and nasal congestion onset yesterday .

## 2016-09-08 NOTE — ED Notes (Signed)
Declined W/C at D/C and was escorted to lobby by RN. 

## 2016-10-03 ENCOUNTER — Encounter (HOSPITAL_COMMUNITY): Payer: Self-pay | Admitting: Emergency Medicine

## 2016-10-03 ENCOUNTER — Emergency Department (HOSPITAL_COMMUNITY)
Admission: EM | Admit: 2016-10-03 | Discharge: 2016-10-03 | Disposition: A | Discharge status: Other Acute Inpatient Hospital | Payer: Self-pay | Attending: Emergency Medicine | Admitting: Emergency Medicine

## 2016-10-03 DIAGNOSIS — F322 Major depressive disorder, single episode, severe without psychotic features: Secondary | ICD-10-CM | POA: Diagnosis present

## 2016-10-03 DIAGNOSIS — Z79899 Other long term (current) drug therapy: Secondary | ICD-10-CM | POA: Insufficient documentation

## 2016-10-03 DIAGNOSIS — F17200 Nicotine dependence, unspecified, uncomplicated: Secondary | ICD-10-CM | POA: Insufficient documentation

## 2016-10-03 DIAGNOSIS — R45851 Suicidal ideations: Secondary | ICD-10-CM

## 2016-10-03 LAB — COMPREHENSIVE METABOLIC PANEL
ALT: 17 U/L (ref 17–63)
AST: 15 U/L (ref 15–41)
Albumin: 5 g/dL (ref 3.5–5.0)
Alkaline Phosphatase: 83 U/L (ref 38–126)
Anion gap: 7 (ref 5–15)
BILIRUBIN TOTAL: 0.9 mg/dL (ref 0.3–1.2)
BUN: 13 mg/dL (ref 6–20)
CO2: 28 mmol/L (ref 22–32)
CREATININE: 0.83 mg/dL (ref 0.61–1.24)
Calcium: 9.5 mg/dL (ref 8.9–10.3)
Chloride: 103 mmol/L (ref 101–111)
GFR calc Af Amer: >60 mL/min (ref >60)
GLUCOSE: 100 mg/dL — AB (ref 65–99)
Potassium: 4 mmol/L (ref 3.5–5.1)
Sodium: 138 mmol/L (ref 135–145)
TOTAL PROTEIN: 8.5 g/dL — AB (ref 6.5–8.1)

## 2016-10-03 LAB — CBC
HEMATOCRIT: 44.5 % (ref 39.0–52.0)
Hemoglobin: 15.8 g/dL (ref 13.0–17.0)
MCH: 29.9 pg (ref 26.0–34.0)
MCHC: 35.5 g/dL (ref 30.0–36.0)
MCV: 84.1 fL (ref 78.0–100.0)
PLATELETS: 322 10*3/uL (ref 150–400)
RBC: 5.29 MIL/uL (ref 4.22–5.81)
RDW: 12.2 % (ref 11.5–15.5)
WBC: 9.6 10*3/uL (ref 4.0–10.5)

## 2016-10-03 LAB — RAPID URINE DRUG SCREEN, HOSP PERFORMED
AMPHETAMINES: NOT DETECTED
BARBITURATES: NOT DETECTED
Benzodiazepines: POSITIVE — AB
Cocaine: NOT DETECTED
Opiates: NOT DETECTED
Tetrahydrocannabinol: NOT DETECTED

## 2016-10-03 LAB — ETHANOL

## 2016-10-03 LAB — SALICYLATE LEVEL: Salicylate Lvl: <7 mg/dL (ref 2.8–30.0)

## 2016-10-03 LAB — ACETAMINOPHEN LEVEL: Acetaminophen (Tylenol), Serum: <10 ug/mL — ABNORMAL LOW (ref 10–30)
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
## 2016-10-03 NOTE — [facility] Assessment (Signed)
[facility] Assessment Progress Note  Per Thedore MinsMojeed Akintayo, MD, this pt requires psychiatric hospitalization at this time.  Pt presents under IVC initiated by EDP Ross Marcusourtney Horton, MD.  At 10:27 Thayer Ohmhris calls from [facility] to report that pt has been accepted to their facility by Dr Jeannine KittenFarah.  Dr Jannifer FranklinAkintayo concurs with this decision.  Pt's nurse, Rudean HittDawnaly, has been notified, and agrees to call report to [phone].  Pt is to be transported via [employer].  Doylene Canninghomas Cheryll Keisler, MA Triage Specialist [phone]
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
## 2016-10-03 NOTE — ED Notes (Signed)
[phone] Phone number for officer that pt reports to for ankle bracelet monitoring

## 2016-10-03 NOTE — ED Notes (Signed)
MD at bedside. 
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
## 2016-10-03 NOTE — ED Notes (Signed)
TTS at bedside. 

## 2016-10-03 NOTE — BH Assessment (Addendum)
Assessment Note  Hector Sutton is an 21 y.o. male presenting to [facility] for a psychiatric evaluation for suicidal ideations earlier today.  Patient was placed under IVC by ED Physician. Patient states that "my dad is an alcoholic and he is a veteran, he's been on cocaine since I was about 8." Patient states that his father has been having health problems. Patient states "it had me depressed tonight, I'm not like that, I'm a happy outgoing person so I called my mom." Patient states that he asked his mother to come pick him up "since I'm on probation I don't need to be around him, I was just out of it, I wasn't thinking, I told my mom I don't want to be in the world no more, I went to her house, she calmed me down, I ate some food and my mom said I need to get checked out." Patient states "I got a lot going for myslef, I just don't want to lose my daddy and that's why I need to live with my mom now." Patient denies suicidal ideations at this time and denies intent or plan but states that he did have them earlier today when he called his mother. Patient denies history of attempts. Patient denies self-injurious behaviors. Patient denies homicidal ideations. Patient denies history of aggression. Patient denies access to firearms and is stating that he "cannot even get my hands on one if I wanted one." Per chart review patients mother states that she "confiscated his firearms." Patient continues to deny. Patient states that he has an upcoming court date in march for DWLR and Disguising plate. Patient states that he is currently on probation for counterfeiting money. Patient states that after he spoke with his mother earlier he had his probation switched to his mothers house as he plans to live with her once released.   Patient denies auditory and visual hallucinations. Patient does not appear to be responding to internal stimuli during the assessment. Patient denies use of drugs and alcohol. Patient UDS +  Benzodiazepines and BAL <5 upon arrival.   Patient is alert and oriented x4. Patient is calm and cooperative and appears depressed. Patient mood and affect are congruent, however, patient denies feeling depressed at this time. Patient also denies symptoms of depression when asked. Patient states that he recently got his GED, he has a child on the way, and plans to go to college. Patient states "i got too much going for myself to die, I was just caught in the moment." Patient states that he was previously treated at [facility] for Bipolar Disorder. Patient denies previous inpatient psychiatric treatment.  Patient states that his mother is very supportive and feels safe discharging to live with her. Patient denies history of trauma/abuse.   Consulted with Donell Sievert, PA-C who recommends inpatient treatment at this time. No appropriate beds available at [facility]. TTS to seek placement.    Diagnosis: Bipolar Disorder   Past Medical History:  Past Medical History:  Diagnosis Date  . Heart murmur     History reviewed. No pertinent surgical history.  Family History: No family history on file.  Social History:  reports that he has been smoking.  He has never used smokeless tobacco. He reports that he drinks alcohol. He reports that he uses drugs, including Marijuana.  Additional Social History:  Alcohol / Drug Use Pain Medications: Denies Prescriptions: Denies Over the Counter: Denies History of alcohol / drug use?: No history of alcohol / drug abuse  CIWA: CIWA-Ar BP: 154/74 Pulse Rate: 97 COWS:    Allergies: No Known Allergies  Home Medications:  (Not in a hospital admission)  OB/GYN Status:  No LMP for male patient.  General Assessment Data Location of Assessment: [facility] TTS Assessment: In system Is this a Tele or Face-to-Face Assessment?: Face-to-Face Is this an Initial Assessment or a Re-assessment for this encounter?: Initial Assessment Marital status:  Single Is patient pregnant?: No Pregnancy Status: No Living Arrangements: Parent (with mother) Can pt return to current living arrangement?: Yes Admission Status: Involuntary Is patient capable of signing voluntary admission?: No (under IVC) Referral Source: Self/Family/Friend     Crisis Care Plan Living Arrangements: Parent (with mother) Name of Psychiatrist: None Name of Therapist: None  Education Status Is patient currently in school?: No Highest grade of school patient has completed: GED  Risk to self with the past 6 months Suicidal Ideation: No Has patient been a risk to self within the past 6 months prior to admission? : Other (comment) (suicidal ideations no plan ) Suicidal Intent: No Has patient had any suicidal intent within the past 6 months prior to admission? : No Is patient at risk for suicide?: Yes Suicidal Plan?: No Has patient had any suicidal plan within the past 6 months prior to admission? : No Access to Means: No What has been your use of drugs/alcohol within the last 12 months?: Denies Previous Attempts/Gestures: No How many times?: 0 Other Self Harm Risks: Denies Triggers for Past Attempts: None known Intentional Self Injurious Behavior: None Family Suicide History: No Recent stressful life event(s): Other (Comment) (father abuses drugs and alcohol) Persecutory voices/beliefs?: No Depression: No (denies) Depression Symptoms:  (denies symptoms) Substance abuse history and/or treatment for substance abuse?: Yes Suicide prevention information given to non-admitted patients: Not applicable  Risk to Others within the past 6 months Homicidal Ideation: No Does patient have any lifetime risk of violence toward others beyond the six months prior to admission? : No Thoughts of Harm to Others: No Current Homicidal Intent: No Current Homicidal Plan: No Access to Homicidal Means: No Identified Victim: Denies History of harm to others?: No Assessment of  Violence: None Noted Violent Behavior Description: Denies Does patient have access to weapons?: No (patient denies) Criminal Charges Pending?: Yes Describe Pending Criminal Charges: DWLR, Disguising plates Does patient have a court date: Yes Court Date: 10/23/16 Is patient on probation?: Yes (pessession of counterfeit)  Psychosis Hallucinations: None noted Delusions: None noted  Mental Status Report Appearance/Hygiene: In scrubs Eye Contact: Fair Motor Activity: Unable to assess Speech: Logical/coherent Level of Consciousness: Alert Mood: Depressed Affect: Appropriate to circumstance, Depressed Anxiety Level: None Thought Processes: Coherent, Relevant Judgement: Partial Orientation: Person, Place, Time, Situation, Appropriate for developmental age Obsessive Compulsive Thoughts/Behaviors: None  Cognitive Functioning Concentration: Good Memory: Recent Intact, Remote Intact IQ: Average Insight: Fair Impulse Control: Fair Appetite: Good Sleep: No Change Total Hours of Sleep: 8 Vegetative Symptoms: None  ADLScreening [facility] Assessment Services) Patient's cognitive ability adequate to safely complete daily activities?: Yes Patient able to express need for assistance with ADLs?: Yes Independently performs ADLs?: Yes (appropriate for developmental age)  Prior Inpatient Therapy Prior Inpatient Therapy: No Prior Therapy Dates: N/A Prior Therapy Facilty/Provider(s): N/A Reason for Treatment: N/A  Prior Outpatient Therapy Prior Outpatient Therapy: Yes Prior Therapy Dates: at age 22 Prior Therapy Facilty/Provider(s): [facility]  Reason for Treatment: Bipolar Disorder Does patient have an ACCT team?: No Does patient have Intensive In-House Services?  : No Does patient have [employer]  services? : No Does patient have P4CC services?: No  ADL Screening (condition at time of admission) Patient's cognitive ability adequate to safely complete daily activities?: Yes Is the patient  deaf or have difficulty hearing?: No Does the patient have difficulty seeing, even when wearing glasses/contacts?: No Does the patient have difficulty concentrating, remembering, or making decisions?: No Patient able to express need for assistance with ADLs?: Yes Does the patient have difficulty dressing or bathing?: No Independently performs ADLs?: Yes (appropriate for developmental age) Does the patient have difficulty walking or climbing stairs?: No Weakness of Legs: None Weakness of Arms/Hands: None  Home Assistive Devices/Equipment Home Assistive Devices/Equipment: None    Abuse/Neglect Assessment (Assessment to be complete while patient is alone) Physical Abuse: Denies Verbal Abuse: Denies Sexual Abuse: Denies Exploitation of patient/patient's resources: Denies Self-Neglect: Denies Values / Beliefs Cultural Requests During Hospitalization: None Spiritual Requests During Hospitalization: None   Advance Directives (For Healthcare) Does Patient Have a Medical Advance Directive?: No Would patient like information on creating a medical advance directive?: No - Patient declined    Additional Information 1:1 In Past 12 Months?: No CIRT Risk: No Elopement Risk: No Does patient have medical clearance?: Yes     Disposition:  Disposition Initial Assessment Completed for this Encounter: Yes Disposition of Patient: Inpatient treatment program (per Donell SievertSpencer Simon, PA-C) Type of inpatient treatment program: Adult  On Site Evaluation by:   Reviewed with Physician:    Jerelle Virden 10/03/2016 4:37 AM

## 2016-10-03 NOTE — ED Provider Notes (Addendum)
[facility]PT Provider Note   CSN: [id number] Arrival date & time: 10/03/16  0130  By signing my name below, I, Modena Jansky, attest that this documentation has been prepared under the direction and in the presence of Shon Baton, MD. Electronically Signed: Modena Jansky, Scribe. 10/03/2016. 2:45 AM.  History   Chief Complaint Chief Complaint  Patient presents with  . Suicidal   The history is provided by the patient. No language interpreter was used.   HPI Comments: Hector Sutton is a 21 y.o. male who presents to the Emergency Department complaining of SI that started today. He wanted kill himself because he's sad about his father abusing cocaine.He reports that he called his mother and told her that he was ready to "end it". He had a gun with him and had drunken 2 beers then. He fell asleep, woke up, and was checked-in at the ED by his mother. Since then, his mother confiscated his firearms and talked to him. He reports being calm now with no SI. He reports having a PMHx of bipolar disorder at age 32. He denies any hx of drug abuse, HI, or other complaints.   Past Medical History:  Diagnosis Date  . Heart murmur     There are no active problems to display for this patient.   History reviewed. No pertinent surgical history.     Home Medications    Prior to Admission medications   Medication Sig Start Date End Date Taking? Authorizing Provider  benzonatate (TESSALON) 100 MG capsule Take 1 capsule (100 mg total) by mouth 3 (three) times daily as needed for cough. 09/08/16   Everlene Farrier, PA-C  cetirizine (ZYRTEC ALLERGY) 10 MG tablet Take 1 tablet (10 mg total) by mouth daily. 09/08/16   Everlene Farrier, PA-C  fluticasone (FLONASE) 50 MCG/ACT nasal spray Place 2 sprays into both nostrils daily. 09/08/16   Everlene Farrier, PA-C  metoCLOPramide (REGLAN) 10 MG tablet Take 1 tablet (10 mg total) by mouth every 6 (six) hours as needed for nausea. Patient not taking:  Reported on 06/18/2016 02/07/15   Charlynne Pander, MD  naproxen (NAPROSYN) 500 MG tablet Take 1 tablet (500 mg total) by mouth 2 (two) times daily with a meal. 09/08/16   Everlene Farrier, PA-C    Family History No family history on file.  Social History Social History  Substance Use Topics  . Smoking status: Current Every Day Smoker  . Smokeless tobacco: Never Used  . Alcohol use Yes     Allergies   Patient has no known allergies.   Review of Systems Review of Systems  Constitutional: Negative for fever.  Respiratory: Negative for shortness of breath.   Cardiovascular: Negative for chest pain.  Psychiatric/Behavioral: Positive for suicidal ideas.  All other systems reviewed and are negative.    Physical Exam Updated Vital Signs BP 154/74 (BP Location: Left Arm)   Pulse 97   Temp 98.2 F (36.8 C) (Oral)   Resp 22   SpO2 100%   Physical Exam  Constitutional: He is oriented to person, place, and time. He appears well-developed and well-nourished.  HENT:  Head: Normocephalic and atraumatic.  Cardiovascular: Normal rate, regular rhythm and normal heart sounds.   No murmur heard. Pulmonary/Chest: Effort normal and breath sounds normal. No respiratory distress. He has no wheezes.  Abdominal: Soft. Bowel sounds are normal. There is no tenderness. There is no rebound.  Neurological: He is alert and oriented to person, place, and time.  Skin: Skin  is warm and dry.  Psychiatric: He has a normal mood and affect.  Nursing note and vitals reviewed.    ED Treatments / Results  DIAGNOSTIC STUDIES: Oxygen Saturation is 100% on RA, normal by my interpretation.    COORDINATION OF CARE: 2:49 AM- Pt advised of plan for treatment and pt agrees.  Labs (all labs ordered are listed, but only abnormal results are displayed) Labs Reviewed  COMPREHENSIVE METABOLIC PANEL - Abnormal; Notable for the following:       Result Value   Glucose, Bld 100 (*)    Total Protein 8.5 (*)     All other components within normal limits  ACETAMINOPHEN LEVEL - Abnormal; Notable for the following:    Acetaminophen (Tylenol), Serum <10 (*)    All other components within normal limits  RAPID URINE DRUG SCREEN, HOSP PERFORMED - Abnormal; Notable for the following:    Benzodiazepines POSITIVE (*)    All other components within normal limits  ETHANOL  SALICYLATE LEVEL  CBC    EKG  EKG Interpretation None       Radiology No results found.  Procedures Procedures (including critical care time)  Medications Ordered in ED Medications - No data to display   Initial Impression / Assessment and Plan / ED Course  I have reviewed the triage vital signs and the nursing notes.  Pertinent labs & imaging results that were available during my care of the patient were reviewed by me and considered in my medical decision making (see chart for details).     Patient presents with suicidal ideation. Was brought in voluntarily by his mother. He endorses that he previously was suicidal and did have a gun. However, he states he feels much better now. Denies prior history of the same. Does have a history of bipolar. Lab work placed in TTS consulted.  4:29 AM TTS Recommending inpatient treatment. I agree. Patient is unwilling to stay voluntarily. IVC paperwork filled out and signed. Final Clinical Impressions(s) / ED Diagnoses   Final diagnoses:  Suicidal thoughts    New Prescriptions New Prescriptions   No medications on file   I personally performed the services described in this documentation, which was scribed in my presence. The recorded information has been reviewed and is accurate.     Shon Batonourtney F Horton, MD 10/03/16 [id number]    Shon Batonourtney F Horton, MD 10/03/16 0430

## 2016-10-03 NOTE — ED Triage Notes (Addendum)
Pt states his mother brought him to the ER in the back of her car while he was asleep and "made him check-in"; pt states he told his mother he wanted to kill himself because he's tired of dealing with his father using cocaine and "slowly killing himself"; pt initially refused to change into scrubs but reluctantly agreed; pt tearful while talking to this nurse; pt states he uses benzos "off the street" to self-medicate; pt states he wants to kill himself but refuses to expand; ETOH on board

## 2016-10-03 NOTE — ED Notes (Signed)
Patient transferred to [facility].  He was tearful at first, but left the unit willingly with [facility].  He was escorted off the unit with hospital security.

## 2016-10-03 NOTE — BH Assessment (Addendum)
Assessment completed.  Consulted with Donell SievertSpencer Simon, PA-C who recommends inpatient treatment at this time. Informed EDP who is in agreement. [facility]. TTS to seek placement.   Davina PokeJoVea Sarah Baez, LCSW Therapeutic Triage Specialist [facility] 10/03/2016 4:15 AM
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
## 2016-10-03 NOTE — ED Notes (Signed)
Pt refusing blood draw.

## 2016-10-03 NOTE — BH Assessment (Signed)
Patient referred to the following facilities:  [facility] - [phone] [facility] - [phone] [facility] - [phone] [facility] - [phone] [facility] - [phone]   Hector PokeJoVea Haniyyah Sakuma, LCSW Therapeutic Triage Specialist [facility] 10/03/2016 5:17 AM

## 2018-07-11 ENCOUNTER — Emergency Department (HOSPITAL_COMMUNITY): Payer: Self-pay

## 2018-07-11 ENCOUNTER — Encounter (HOSPITAL_COMMUNITY): Payer: Self-pay | Admitting: Emergency Medicine

## 2018-07-11 ENCOUNTER — Other Ambulatory Visit: Payer: Self-pay

## 2018-07-11 ENCOUNTER — Emergency Department (HOSPITAL_COMMUNITY)
Admission: EM | Admit: 2018-07-11 | Discharge: 2018-07-11 | Disposition: A | Discharge status: Home / Self Care | Payer: Self-pay | Attending: Emergency Medicine | Admitting: Emergency Medicine

## 2018-07-11 DIAGNOSIS — Y998 Other external cause status: Secondary | ICD-10-CM | POA: Insufficient documentation

## 2018-07-11 DIAGNOSIS — Y939 Activity, unspecified: Secondary | ICD-10-CM | POA: Insufficient documentation

## 2018-07-11 DIAGNOSIS — F17200 Nicotine dependence, unspecified, uncomplicated: Secondary | ICD-10-CM | POA: Insufficient documentation

## 2018-07-11 DIAGNOSIS — Y929 Unspecified place or not applicable: Secondary | ICD-10-CM | POA: Insufficient documentation

## 2018-07-11 DIAGNOSIS — Y33XXXA Other specified events, undetermined intent, initial encounter: Secondary | ICD-10-CM | POA: Insufficient documentation

## 2018-07-11 DIAGNOSIS — S0990XA Unspecified injury of head, initial encounter: Secondary | ICD-10-CM | POA: Insufficient documentation

## 2018-07-11 MED ORDER — AMOXICILLIN 500 MG PO CAPS: 500.0000 mg | ORAL_CAPSULE | Freq: Three times a day (TID) | ORAL | 0 refills | Status: AC

## 2018-07-11 MED ORDER — TRAMADOL HCL 50 MG PO TABS: 50.0000 mg | ORAL_TABLET | Freq: Four times a day (QID) | ORAL | 0 refills | Status: AC | PRN

## 2018-07-11 NOTE — Discharge Instructions (Signed)
Follow-up with Dr. Kenney Housemanrab in 1 to 2 weeks to look into fixing your fractured nasal bone
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
## 2018-07-11 NOTE — ED Notes (Signed)
Patient back from CT.
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
